# Patient Record
Sex: Female | Born: 2000 | Race: Black or African American | Hispanic: No | Marital: Single | Smoking: Never smoker
Health system: Southern US, Community
[De-identification: ages and names within clinical notes are randomized; demographics above are authoritative.]

---
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
## 2022-06-04 ENCOUNTER — Emergency Department (HOSPITAL_COMMUNITY)
Admission: EM | Admit: 2022-06-04 | Discharge: 2022-06-04 | Disposition: A | Discharge status: Home / Self Care | Payer: Medicaid Other | Attending: Emergency Medicine | Admitting: Emergency Medicine

## 2022-06-04 ENCOUNTER — Emergency Department (HOSPITAL_COMMUNITY): Payer: Medicaid Other

## 2022-06-04 ENCOUNTER — Other Ambulatory Visit: Payer: Self-pay

## 2022-06-04 ENCOUNTER — Encounter (HOSPITAL_COMMUNITY): Payer: Self-pay

## 2022-06-04 DIAGNOSIS — R079 Chest pain, unspecified: Secondary | ICD-10-CM

## 2022-06-04 DIAGNOSIS — R059 Cough, unspecified: Secondary | ICD-10-CM | POA: Insufficient documentation

## 2022-06-04 DIAGNOSIS — R0602 Shortness of breath: Secondary | ICD-10-CM | POA: Diagnosis not present

## 2022-06-04 DIAGNOSIS — R0789 Other chest pain: Secondary | ICD-10-CM | POA: Diagnosis present

## 2022-06-04 LAB — CBC WITH DIFFERENTIAL/PLATELET
Abs Immature Granulocytes: 0.03 10*3/uL (ref 0.00–0.07)
Basophils Absolute: 0.1 10*3/uL (ref 0.0–0.1)
Basophils Relative: 1 %
Eosinophils Absolute: 0.2 10*3/uL (ref 0.0–0.5)
Eosinophils Relative: 2 %
HCT: 38.5 % (ref 36.0–46.0)
Hemoglobin: 12.6 g/dL (ref 12.0–15.0)
Immature Granulocytes: 0 %
Lymphocytes Relative: 28 %
Lymphs Abs: 2.9 10*3/uL (ref 0.7–4.0)
MCH: 28.1 pg (ref 26.0–34.0)
MCHC: 32.7 g/dL (ref 30.0–36.0)
MCV: 85.7 fL (ref 80.0–100.0)
Monocytes Absolute: 0.7 10*3/uL (ref 0.1–1.0)
Monocytes Relative: 7 %
Neutro Abs: 6.5 10*3/uL (ref 1.7–7.7)
Neutrophils Relative %: 62 %
Platelets: 293 10*3/uL (ref 150–400)
RBC: 4.49 MIL/uL (ref 3.87–5.11)
RDW: 13.4 % (ref 11.5–15.5)
WBC: 10.4 10*3/uL (ref 4.0–10.5)
nRBC: 0 % (ref 0.0–0.2)

## 2022-06-04 LAB — BASIC METABOLIC PANEL
Anion gap: 7 (ref 5–15)
BUN: 15 mg/dL (ref 6–20)
CO2: 26 mmol/L (ref 22–32)
Calcium: 9.3 mg/dL (ref 8.9–10.3)
Chloride: 105 mmol/L (ref 98–111)
Creatinine, Ser: 0.75 mg/dL (ref 0.44–1.00)
GFR, Estimated: >60 mL/min (ref >60)
Glucose, Bld: 95 mg/dL (ref 70–99)
Potassium: 3.8 mmol/L (ref 3.5–5.1)
Sodium: 138 mmol/L (ref 135–145)

## 2022-06-04 LAB — TROPONIN I (HIGH SENSITIVITY): Troponin I (High Sensitivity): <2 ng/L (ref <18)

## 2022-06-04 MED ORDER — ACETAMINOPHEN 325 MG PO TABS
650.0000 mg | ORAL_TABLET | Freq: Once | ORAL | Status: AC
  Administered 2022-06-04: 650 mg via ORAL
  Filled 2022-06-04: qty 2

## 2022-06-04 NOTE — ED Provider Notes (Signed)
[facility] EMERGENCY DEPARTMENT Provider Note   CSN: [id number] Arrival date & time: 06/04/22  1933     History  Chief Complaint  Patient presents with   Chest Pain    Melinda Mcbride is a 21 y.o. female.  Patient is an 21 year old female with no significant past medical history presenting to the emergency department with chest pain.  The patient states that she has had some midsternal chest pressure with shortness of breath on and off since yesterday.  She states that she was not doing anything when the pain started and it does not seem to be affected by any activities or movements.  She states that the pain gets worse when taking deep breaths.  She states that she did have a mild dry cough yesterday.  She denies any fevers or chills, runny nose or sore throat.  She denies any lower extremity swelling.  She states the pain is unaffected by eating.  She denies any history of blood clots, recent hospitalizations or surgeries, recent long travels in the car or plane, hormone use or cancer history.  She denies any family history of early cardiac disease or sudden unexplained death.  The history is provided by the patient and a parent.  Chest Pain      Home Medications Prior to Admission medications   Not on File      Allergies    Patient has no allergy information on record.    Review of Systems   Review of Systems  Cardiovascular:  Positive for chest pain.    Physical Exam Updated Vital Signs BP (!) 131/92 (BP Location: Right Arm)   Pulse 80   Temp 98.3 F (36.8 C) (Oral)   Resp 16   Ht 5\' 7"  (1.702 m)   Wt 108.9 kg   LMP 05/13/2022 (Exact Date)   SpO2 100%   BMI 37.59 kg/m  Physical Exam Vitals and nursing note reviewed.  Constitutional:      General: She is not in acute distress.    Appearance: She is well-developed. She is obese.  HENT:     Head: Normocephalic and atraumatic.  Eyes:     Extraocular Movements: Extraocular movements intact.  Cardiovascular:      Rate and Rhythm: Normal rate and regular rhythm.     Pulses:          Radial pulses are 2+ on the right side and 2+ on the left side.       Dorsalis pedis pulses are 2+ on the right side and 2+ on the left side.     Heart sounds: Normal heart sounds.  Pulmonary:     Effort: Pulmonary effort is normal. No respiratory distress.     Breath sounds: Normal breath sounds.  Chest:     Chest wall: No tenderness.  Abdominal:     Palpations: Abdomen is soft.     Tenderness: There is no abdominal tenderness.  Musculoskeletal:        General: Normal range of motion.     Cervical back: Normal range of motion and neck supple.     Right lower leg: No edema.     Left lower leg: No edema.  Skin:    General: Skin is warm and dry.  Neurological:     General: No focal deficit present.     Mental Status: She is alert and oriented to person, place, and time.  Psychiatric:        Mood and Affect: Mood normal.  Behavior: Behavior normal.     ED Results / Procedures / Treatments   Labs (all labs ordered are listed, but only abnormal results are displayed) Labs Reviewed  CBC WITH DIFFERENTIAL/PLATELET  BASIC METABOLIC PANEL  TROPONIN I (HIGH SENSITIVITY)    EKG EKG Interpretation  Date/Time:  Friday June 04 2022 20:39:23 EDT Ventricular Rate:  71 PR Interval:  141 QRS Duration: 75 QT Interval:  369 QTC Calculation: 401 R Axis:   69 Text Interpretation: Sinus rhythm No previous ECGs available Confirmed by Arturo Morton ([id number]) on 06/04/2022 9:00:39 PM  Radiology DG Chest 2 View  Result Date: 06/04/2022 CLINICAL DATA:  Chest pain and shortness of breath EXAM: CHEST - 2 VIEW COMPARISON:  None Available. FINDINGS: The heart size and mediastinal contours are within normal limits. Both lungs are clear. The visualized skeletal structures are unremarkable. IMPRESSION: No active cardiopulmonary disease. Electronically Signed   By: Alcide Clever M.D.   On: 06/04/2022 20:40     Procedures Procedures    Medications Ordered in ED Medications  acetaminophen (TYLENOL) tablet 650 mg (has no administration in time range)    ED Course/ Medical Decision Making/ A&P Clinical Course as of 06/04/22 2256  Fri Jun 04, 2022  2234 Labs reviewed and interpreted by myself are within normal range.  Troponin is negative and EKG reviewed and interpreted by myself without acute ischemic changes or arrhythmia, making ACS or arrhythmia unlikely cause of her chest pain.  Chest x-ray is negative for any signs of pneumothorax or pneumonia. [VK]  2254 Upon reassessment, the patient reports improvement of her pain.  She is stable for discharge home with outpatient primary care follow-up.  Her questions were answered and she was given strict return precautions. [VK]    Clinical Course User Index [VK] Melinda Sharps, DO                           Medical Decision Making Patient is an 21 year old female with no significant past medical history presenting to the emergency department with chest pain.  Patient has no wheezing on exam making asthma unlikely.  She was initially evaluated by triage and had EKG, chest x-ray and labs performed.  EKG reviewed and interpreted by myself, normal sinus rhythm without any signs of arrhythmia or ischemia.  Chest x-ray reviewed and shows no acute disease, making pneumothorax, cardiomegaly, pneumonia unlikely cause of her symptoms.  Labs are pending at this time to evaluate for anemia.  She is low risk for PE with a negative PERC score making PE unlikely.  She will be given Tylenol for pain and be reassessed.  Amount and/or Complexity of Data Reviewed Labs: ordered. Decision-making details documented in ED Course. Radiology: ordered. Decision-making details documented in ED Course. ECG/medicine tests: ordered. Decision-making details documented in ED Course.  Risk OTC drugs.           Final Clinical Impression(s) / ED Diagnoses Final  diagnoses:  None    Rx / DC Orders ED Discharge Orders     None         Melinda Sharps, DO 06/04/22 2257

## 2022-06-04 NOTE — ED Triage Notes (Signed)
Pt arrived via POV c/o CP since last night. Pt reports sternal CP worsened this morning with radiation to left shoulder blade. Pt reports pain worsens with deep breaths. Denies injury.

## 2022-06-04 NOTE — ED Notes (Signed)
Patient ambulatory to restroom with no assistance.  

## 2022-06-04 NOTE — Discharge Instructions (Signed)
You were seen in the emergency department for your chest pain.  You had no signs of heart attack, stress in your heart, pneumonia, injury to your lungs, or abnormal heart rhythms.  It is unclear what the cause of your chest pain at this time is.  You can take Tylenol as needed for your chest pain.  You should follow-up with your primary doctor in the next few days to have your symptoms rechecked.  You should return to the emergency department if your pain gets significantly worse, you have worsening shortness of breath, you pass out, or if you have any other new or concerning symptoms.

## 2024-03-16 ENCOUNTER — Ambulatory Visit
Admission: EM | Admit: 2024-03-16 | Discharge: 2024-03-16 | Disposition: A | Discharge status: Home / Self Care | Attending: Nurse Practitioner | Admitting: Nurse Practitioner

## 2024-03-16 DIAGNOSIS — Z113 Encounter for screening for infections with a predominantly sexual mode of transmission: Secondary | ICD-10-CM | POA: Diagnosis present

## 2024-03-16 NOTE — ED Triage Notes (Signed)
 Pt states she is here for STD testing. Pt denies any symptoms.

## 2024-03-16 NOTE — ED Provider Notes (Signed)
 [facility]    CSN: [id number] Arrival date & time: 03/16/24  1101      History   Chief Complaint Chief Complaint  Patient presents with   SEXUALLY TRANSMITTED DISEASE    HPI Melinda Mcbride is a 23 y.o. female.   The history is provided by the patient.   Patient presents for STI testing.  Patient denies vaginal discharge, vaginal odor, vaginal itching, dysuria, urinary frequency, urgency, low back pain, or flank pain.  Patient reports 2 female partners in the past 90 days.  Reports prior history of chlamydia.  Last menstrual cycle 02/26/24.  History reviewed. No pertinent past medical history.  There are no active problems to display for this patient.   History reviewed. No pertinent surgical history.  OB History   No obstetric history on file.      Home Medications    Prior to Admission medications   Not on File    Family History Family History  Problem Relation Age of Onset   Hyperlipidemia Mother    Hypertension Mother    Asthma Mother    Diabetes Mother    Heart failure Mother    Stroke Father     Social History Social History   Tobacco Use   Smoking status: Never    Passive exposure: Never   Smokeless tobacco: Never  Substance Use Topics   Alcohol use: Never   Drug use: Never     Allergies   Patient has no known allergies.   Review of Systems Review of Systems Per HPI  Physical Exam Triage Vital Signs ED Triage Vitals  Encounter Vitals Group     BP 03/16/24 1128 132/83     Systolic BP Percentile --      Diastolic BP Percentile --      Pulse Rate 03/16/24 1128 97     Resp 03/16/24 1128 16     Temp 03/16/24 1128 98.2 F (36.8 C)     Temp Source 03/16/24 1128 Oral     SpO2 03/16/24 1128 98 %     Weight --      Height --      Head Circumference --      Peak Flow --      Pain Score 03/16/24 1129 0     Pain Loc --      Pain Education --      Exclude from Growth Chart --    No data found.  Updated Vital Signs BP  132/83 (BP Location: Right Arm)   Pulse 97   Temp 98.2 F (36.8 C) (Oral)   Resp 16   LMP 02/26/2024 (Exact Date)   SpO2 98%   Visual Acuity Right Eye Distance:   Left Eye Distance:   Bilateral Distance:    Right Eye Near:   Left Eye Near:    Bilateral Near:     Physical Exam Vitals and nursing note reviewed.  Constitutional:      General: She is not in acute distress.    Appearance: Normal appearance.  HENT:     Head: Normocephalic.  Eyes:     Extraocular Movements: Extraocular movements intact.     Conjunctiva/sclera: Conjunctivae normal.     Pupils: Pupils are equal, round, and reactive to light.  Cardiovascular:     Rate and Rhythm: Normal rate and regular rhythm.     Pulses: Normal pulses.     Heart sounds: Normal heart sounds.  Pulmonary:     Effort: Pulmonary  effort is normal. No respiratory distress.     Breath sounds: Normal breath sounds. No stridor. No wheezing, rhonchi or rales.  Abdominal:     General: Bowel sounds are normal.     Palpations: Abdomen is soft.     Tenderness: There is no abdominal tenderness.  Musculoskeletal:     Cervical back: Normal range of motion.  Skin:    General: Skin is warm and dry.  Neurological:     General: No focal deficit present.     Mental Status: She is alert and oriented to person, place, and time.  Psychiatric:        Mood and Affect: Mood normal.        Behavior: Behavior normal.      UC Treatments / Results  Labs (all labs ordered are listed, but only abnormal results are displayed) Labs Reviewed - No data to display  EKG   Radiology No results found.  Procedures Procedures (including critical care time)  Medications Ordered in UC Medications - No data to display  Initial Impression / Assessment and Plan / UC Course  I have reviewed the triage vital signs and the nursing notes.  Pertinent labs & imaging results that were available during my care of the patient were reviewed by me and considered  in my medical decision making (see chart for details).  Cytology swab is pending.  Supportive care recommendations were provided and discussed with the patient to include refraining from sexual intercourse until test results are received, notifying all partners if test results are positive, and refraining from sexual intercourse for an additional 7 days after completing treatment if she test positive.  Patient was in agreement with this plan of care and verbalizes understanding.  All questions were answered.  Patient stable for discharge.  Final Clinical Impressions(s) / UC Diagnoses   Final diagnoses:  None   Discharge Instructions   None    ED Prescriptions   None    PDMP not reviewed this encounter.   Hardy Lia, NP 03/16/24 1154

## 2024-03-16 NOTE — Discharge Instructions (Addendum)
 Cytology swab is pending.  You will be contacted if the pending test results are positive.  You will also have access to your results via MyChart. Increase condom use with each sexual encounter. Recommend refraining from sexual intercourse until your test results are received. If your test results are positive, and require treatment, please refrain from sexual intercourse for an additional 7 days after completing treatment. If your test results are positive, please notify all partners. Follow-up as needed.

## 2024-03-20 LAB — CERVICOVAGINAL ANCILLARY ONLY
Bacterial Vaginitis (gardnerella): NEGATIVE
Candida Glabrata: NEGATIVE
Candida Vaginitis: NEGATIVE
Chlamydia: POSITIVE — AB
Comment: NEGATIVE
Comment: NEGATIVE
Comment: NEGATIVE
Comment: NEGATIVE
Comment: NEGATIVE
Comment: NORMAL
Neisseria Gonorrhea: NEGATIVE
Trichomonas: NEGATIVE

## 2024-03-21 ENCOUNTER — Ambulatory Visit (HOSPITAL_COMMUNITY): Payer: Self-pay

## 2024-03-21 ENCOUNTER — Telehealth: Payer: Self-pay | Admitting: Nurse Practitioner

## 2024-03-21 ENCOUNTER — Telehealth: Payer: Self-pay

## 2024-03-21 MED ORDER — DOXYCYCLINE HYCLATE 100 MG PO CAPS: 100.0000 mg | ORAL_CAPSULE | Freq: Two times a day (BID) | ORAL | 0 refills | Status: AC

## 2024-03-21 NOTE — Telephone Encounter (Signed)
 Cytology swab positive for Chlamydia. Doxycycline 100mg  sent to patient's preferred pharmacy.

## 2024-03-21 NOTE — Telephone Encounter (Signed)
 Pt called inquiring about the positive results she has seen on MyChart. After reviewing pt chart, I informed her that she did test positive for chlamydia and verified pharmacy she'd like her doxy script to go to. Script sent to [facility]  [address] in [place], per pt request.  Pt identified using 2 identifiers. Pt verbalized understanding on refraining from sexual intercourse for 7 days while on treatment. Informed pt she should be retested in 2 weeks - a month after treatment.
